# Patient Record
Sex: Female | Born: 1995 | Race: White | Hispanic: No | Marital: Single | Smoking: Never smoker
Health system: Southern US, Community
[De-identification: ages and names within clinical notes are randomized; demographics above are authoritative.]

## PROBLEM LIST (undated history)

## (undated) DIAGNOSIS — E079 Disorder of thyroid, unspecified: Secondary | ICD-10-CM

## (undated) DIAGNOSIS — J45909 Unspecified asthma, uncomplicated: Secondary | ICD-10-CM

## (undated) DIAGNOSIS — D497 Neoplasm of unspecified behavior of endocrine glands and other parts of nervous system: Secondary | ICD-10-CM

## (undated) HISTORY — PX: WISDOM TOOTH EXTRACTION: SHX21

---

## 2017-06-28 ENCOUNTER — Emergency Department: Payer: Medicaid - Out of State

## 2017-06-28 ENCOUNTER — Observation Stay
Admission: EM | Admit: 2017-06-28 | Discharge: 2017-06-29 | Disposition: A | Discharge status: Home / Self Care | Payer: Medicaid - Out of State | Attending: Internal Medicine | Admitting: Internal Medicine

## 2017-06-28 ENCOUNTER — Other Ambulatory Visit: Payer: Self-pay

## 2017-06-28 ENCOUNTER — Encounter: Payer: Self-pay | Admitting: Emergency Medicine

## 2017-06-28 DIAGNOSIS — J45909 Unspecified asthma, uncomplicated: Secondary | ICD-10-CM | POA: Diagnosis present

## 2017-06-28 DIAGNOSIS — E039 Hypothyroidism, unspecified: Secondary | ICD-10-CM | POA: Insufficient documentation

## 2017-06-28 DIAGNOSIS — F17220 Nicotine dependence, chewing tobacco, uncomplicated: Secondary | ICD-10-CM | POA: Diagnosis not present

## 2017-06-28 DIAGNOSIS — Z79899 Other long term (current) drug therapy: Secondary | ICD-10-CM | POA: Diagnosis not present

## 2017-06-28 DIAGNOSIS — R197 Diarrhea, unspecified: Secondary | ICD-10-CM | POA: Diagnosis present

## 2017-06-28 DIAGNOSIS — A084 Viral intestinal infection, unspecified: Secondary | ICD-10-CM | POA: Diagnosis present

## 2017-06-28 DIAGNOSIS — Z23 Encounter for immunization: Secondary | ICD-10-CM | POA: Insufficient documentation

## 2017-06-28 DIAGNOSIS — N179 Acute kidney failure, unspecified: Secondary | ICD-10-CM | POA: Diagnosis present

## 2017-06-28 HISTORY — DX: Unspecified asthma, uncomplicated: J45.909

## 2017-06-28 HISTORY — DX: Disorder of thyroid, unspecified: E07.9

## 2017-06-28 HISTORY — DX: Neoplasm of unspecified behavior of endocrine glands and other parts of nervous system: D49.7

## 2017-06-28 LAB — BASIC METABOLIC PANEL
ANION GAP: 8 (ref 5–15)
BUN: 20 mg/dL (ref 6–20)
CHLORIDE: 110 mmol/L (ref 101–111)
CO2: 20 mmol/L — AB (ref 22–32)
Calcium: 8.2 mg/dL — ABNORMAL LOW (ref 8.9–10.3)
Creatinine, Ser: 1.8 mg/dL — ABNORMAL HIGH (ref 0.44–1.00)
GFR calc non Af Amer: 40 mL/min — ABNORMAL LOW (ref >60)
GFR, EST AFRICAN AMERICAN: 46 mL/min — AB (ref >60)
GLUCOSE: 84 mg/dL (ref 65–99)
POTASSIUM: 4 mmol/L (ref 3.5–5.1)
Sodium: 138 mmol/L (ref 135–145)

## 2017-06-28 LAB — COMPREHENSIVE METABOLIC PANEL
ALT: 29 U/L (ref 14–54)
AST: 20 U/L (ref 15–41)
Albumin: 3.8 g/dL (ref 3.5–5.0)
Alkaline Phosphatase: 74 U/L (ref 38–126)
Anion gap: 9 (ref 5–15)
BILIRUBIN TOTAL: 0.4 mg/dL (ref 0.3–1.2)
BUN: 22 mg/dL — AB (ref 6–20)
CHLORIDE: 105 mmol/L (ref 101–111)
CO2: 22 mmol/L (ref 22–32)
CREATININE: 1.94 mg/dL — AB (ref 0.44–1.00)
Calcium: 9.2 mg/dL (ref 8.9–10.3)
GFR, EST AFRICAN AMERICAN: 42 mL/min — AB (ref >60)
GFR, EST NON AFRICAN AMERICAN: 36 mL/min — AB (ref >60)
Glucose, Bld: 90 mg/dL (ref 65–99)
POTASSIUM: 4.3 mmol/L (ref 3.5–5.1)
Sodium: 136 mmol/L (ref 135–145)
TOTAL PROTEIN: 7.5 g/dL (ref 6.5–8.1)

## 2017-06-28 LAB — URINALYSIS, COMPLETE (UACMP) WITH MICROSCOPIC
BILIRUBIN URINE: NEGATIVE
Bacteria, UA: NONE SEEN
GLUCOSE, UA: NEGATIVE mg/dL
Ketones, ur: NEGATIVE mg/dL
NITRITE: NEGATIVE
Protein, ur: NEGATIVE mg/dL
SPECIFIC GRAVITY, URINE: 1.006 (ref 1.005–1.030)
pH: 6 (ref 5.0–8.0)

## 2017-06-28 LAB — CBC
HEMATOCRIT: 35.9 % (ref 35.0–47.0)
Hemoglobin: 11.6 g/dL — ABNORMAL LOW (ref 12.0–16.0)
MCH: 25.6 pg — ABNORMAL LOW (ref 26.0–34.0)
MCHC: 32.3 g/dL (ref 32.0–36.0)
MCV: 79.4 fL — AB (ref 80.0–100.0)
PLATELETS: 295 10*3/uL (ref 150–440)
RBC: 4.52 MIL/uL (ref 3.80–5.20)
RDW: 16.3 % — AB (ref 11.5–14.5)
WBC: 11.1 10*3/uL — AB (ref 3.6–11.0)

## 2017-06-28 LAB — LIPASE, BLOOD: LIPASE: 28 U/L (ref 11–51)

## 2017-06-28 LAB — POCT PREGNANCY, URINE: Preg Test, Ur: NEGATIVE

## 2017-06-28 LAB — PREGNANCY, URINE: Preg Test, Ur: NEGATIVE

## 2017-06-28 MED ORDER — SODIUM CHLORIDE 0.9 % IV SOLN
INTRAVENOUS | Status: DC
  Administered 2017-06-29 (×2): via INTRAVENOUS

## 2017-06-28 MED ORDER — ACETAMINOPHEN 650 MG RE SUPP: 650.0000 mg | Freq: Four times a day (QID) | RECTAL | Status: DC | PRN

## 2017-06-28 MED ORDER — ONDANSETRON HCL 4 MG PO TABS: 4.0000 mg | ORAL_TABLET | Freq: Four times a day (QID) | ORAL | Status: DC | PRN

## 2017-06-28 MED ORDER — SODIUM CHLORIDE 0.9 % IV BOLUS (SEPSIS)
1000.0000 mL | Freq: Once | INTRAVENOUS | Status: AC
  Administered 2017-06-28: 1000 mL via INTRAVENOUS

## 2017-06-28 MED ORDER — ONDANSETRON HCL 4 MG/2ML IJ SOLN: 4.0000 mg | Freq: Four times a day (QID) | INTRAMUSCULAR | Status: DC | PRN

## 2017-06-28 MED ORDER — SODIUM CHLORIDE 0.9 % IV SOLN
Freq: Once | INTRAVENOUS | Status: AC
  Administered 2017-06-28: 22:00:00 via INTRAVENOUS

## 2017-06-28 MED ORDER — HEPARIN SODIUM (PORCINE) 5000 UNIT/ML IJ SOLN
5000.0000 [IU] | Freq: Three times a day (TID) | INTRAMUSCULAR | Status: DC
  Administered 2017-06-29: 5000 [IU] via SUBCUTANEOUS
  Filled 2017-06-28: qty 1

## 2017-06-28 MED ORDER — ALBUTEROL SULFATE (2.5 MG/3ML) 0.083% IN NEBU: 3.0000 mL | INHALATION_SOLUTION | Freq: Four times a day (QID) | RESPIRATORY_TRACT | Status: DC | PRN

## 2017-06-28 MED ORDER — ACETAMINOPHEN 500 MG PO TABS
1000.0000 mg | ORAL_TABLET | Freq: Once | ORAL | Status: AC
  Administered 2017-06-28: 1000 mg via ORAL
  Filled 2017-06-28 (×2): qty 2

## 2017-06-28 MED ORDER — ACETAMINOPHEN 325 MG PO TABS: 650.0000 mg | ORAL_TABLET | Freq: Four times a day (QID) | ORAL | Status: DC | PRN

## 2017-06-28 MED ORDER — ONDANSETRON HCL 4 MG/2ML IJ SOLN
4.0000 mg | Freq: Once | INTRAMUSCULAR | Status: AC
  Administered 2017-06-28: 4 mg via INTRAVENOUS
  Filled 2017-06-28: qty 2

## 2017-06-28 MED ORDER — SODIUM CHLORIDE 0.9 % IV BOLUS (SEPSIS)
1000.0000 mL | Freq: Once | INTRAVENOUS | Status: AC
Start: 2017-06-28 — End: 2017-06-28
  Administered 2017-06-28: 1000 mL via INTRAVENOUS

## 2017-06-28 NOTE — ED Triage Notes (Addendum)
Pt c/o subjective fever and chills and nausea since Monday.  Pt c/o intermittent nausea.  Pt reports multiple episodes of diarrhea also.    Pt c/o abdominal cramping that is constant but does get worse at times.

## 2017-06-28 NOTE — ED Notes (Signed)
Pt presents with lower abdominal pain x 2 days. States she has had nausea and loose stools as well. Pt reports 4-5 loose stool episodes today. Doesn't really know if she has increased frequency; denies pain/burning upon urination. Pt alert & oriented with NAD noted.

## 2017-06-28 NOTE — H&P (Signed)
[facility] [place] NAME: Courtney Webster    MR#:  [id number]  DATE OF BIRTH:  04/16/1996  DATE OF ADMISSION:  06/28/2017  PRIMARY CARE PHYSICIAN: Patient, No Pcp Per   REQUESTING/REFERRING PHYSICIAN: Alfred Levins, MD  CHIEF COMPLAINT:   Chief Complaint  Patient presents with  . Fever  . Nausea    HISTORY OF PRESENT ILLNESS:  Courtney Webster  is a 21 y.o. female who presents with several days of diarrhea along with some nausea and vomiting.  Patient states that her diarrhea got much worse today and so she came to the ED for evaluation.  Here she was found to have significant AKI.  Hospitalists were called for admission  PAST MEDICAL HISTORY:   Past Medical History:  Diagnosis Date  . Asthma   . Pituitary tumor   . Thyroid disease     PAST SURGICAL HISTORY:   Past Surgical History:  Procedure Laterality Date  . WISDOM TOOTH EXTRACTION      SOCIAL HISTORY:   Social History   Tobacco Use  . Smoking status: Never Smoker  . Smokeless tobacco: Current User    Types: Chew  Substance Use Topics  . Alcohol use: No    Frequency: Never    FAMILY HISTORY:   Family History  Problem Relation Age of Onset  . Diabetes Other     DRUG ALLERGIES:  No Known Allergies  MEDICATIONS AT HOME:   Prior to Admission medications   Not on File    REVIEW OF SYSTEMS:  Review of Systems  Constitutional: Negative for chills, fever, malaise/fatigue and weight loss.  HENT: Negative for ear pain, hearing loss and tinnitus.   Eyes: Negative for blurred vision, double vision, pain and redness.  Respiratory: Negative for cough, hemoptysis and shortness of breath.   Cardiovascular: Negative for chest pain, palpitations, orthopnea and leg swelling.  Gastrointestinal: Positive for diarrhea, nausea and vomiting. Negative for abdominal pain and constipation.  Genitourinary: Negative for dysuria, frequency and hematuria.  Musculoskeletal:  Negative for back pain, joint pain and neck pain.  Skin:       No acne, rash, or lesions  Neurological: Negative for dizziness, tremors, focal weakness and weakness.  Endo/Heme/Allergies: Negative for polydipsia. Does not bruise/bleed easily.  Psychiatric/Behavioral: Negative for depression. The patient is not nervous/anxious and does not have insomnia.      VITAL SIGNS:   Vitals:   06/28/17 1753 06/28/17 1759 06/28/17 2142  BP:  124/84 126/65  Pulse:  82   Resp:  18 16  Temp:  98.2 F (36.8 C) 98.5 F (36.9 C)  TempSrc:  Oral Oral  SpO2:  100% 100%  Weight: (!) 144.7 kg (319 lb)    Height: 5\' 11"  (1.803 m)     Wt Readings from Last 3 Encounters:  06/28/17 (!) 144.7 kg (319 lb)    PHYSICAL EXAMINATION:  Physical Exam  Vitals reviewed. Constitutional: She is oriented to person, place, and time. She appears well-developed and well-nourished. No distress.  HENT:  Head: Normocephalic and atraumatic.  Dry mucous membranes  Eyes: Conjunctivae and EOM are normal. Pupils are equal, round, and reactive to light. No scleral icterus.  Neck: Normal range of motion. Neck supple. No JVD present. No thyromegaly present.  Cardiovascular: Normal rate, regular rhythm and intact distal pulses. Exam reveals no gallop and no friction rub.  No murmur heard. Respiratory: Effort normal and breath sounds normal. No respiratory distress. She has no wheezes.  She has no rales.  GI: Soft. Bowel sounds are normal. She exhibits no distension. There is no tenderness.  Musculoskeletal: Normal range of motion. She exhibits no edema.  No arthritis, no gout  Lymphadenopathy:    She has no cervical adenopathy.  Neurological: She is alert and oriented to person, place, and time. No cranial nerve deficit.  No dysarthria, no aphasia  Skin: Skin is warm and dry. No rash noted. No erythema.  Psychiatric: She has a normal mood and affect. Her behavior is normal. Judgment and thought content normal.     LABORATORY PANEL:   CBC Recent Labs  Lab 06/28/17 1754  WBC 11.1*  HGB 11.6*  HCT 35.9  PLT 295   ------------------------------------------------------------------------------------------------------------------  Chemistries  Recent Labs  Lab 06/28/17 1754 06/28/17 2121  NA 136 138  K 4.3 4.0  CL 105 110  CO2 22 20*  GLUCOSE 90 84  BUN 22* 20  CREATININE 1.94* 1.80*  CALCIUM 9.2 8.2*  AST 20  --   ALT 29  --   ALKPHOS 74  --   BILITOT 0.4  --    ------------------------------------------------------------------------------------------------------------------  Cardiac Enzymes No results for input(s): TROPONINI in the last 168 hours. ------------------------------------------------------------------------------------------------------------------  RADIOLOGY:  Dg Abdomen Acute W/chest  Result Date: 06/28/2017 CLINICAL DATA:  Fever and chills with nausea. EXAM: DG ABDOMEN ACUTE W/ 1V CHEST COMPARISON:  None. FINDINGS: The lungs are clear without focal pneumonia, edema, pneumothorax or pleural effusion. The cardiopericardial silhouette is within normal limits for size. The visualized bony structures of the thorax are intact.Upright film shows no evidence for intraperitoneal free air. There is no evidence for gaseous bowel dilation to suggest obstruction. No unexpected abdominopelvic calcification. Thoracolumbar scoliosis evident. IMPRESSION: 1. No acute cardiopulmonary findings. 2. No intraperitoneal free air or evidence of bowel obstruction. Electronically Signed   By: Misty Stanley M.D.   On: 06/28/2017 20:22    EKG:  No orders found for this or any previous visit.  IMPRESSION AND PLAN:  Principal Problem:   AKI (acute kidney injury) ([place]) -likely due to profound dehydration from her diarrhea and vomiting.  Suspect this is all prerenal injury.  We will hydrate her significantly tonight and reevaluate her renal function in the morning.  Avoid nephrotoxins Active  Problems:   Diarrhea -likely viral, however we will check a GI panel she has any further diarrhea   Asthma -as needed inhaler  All the records are reviewed and case discussed with ED provider. Management plans discussed with the patient and/or family.  DVT PROPHYLAXIS: SubQ heparin  GI PROPHYLAXIS: None  ADMISSION STATUS: Observation  CODE STATUS: Full Code Status History    This patient does not have a recorded code status. Please follow your organizational policy for patients in this situation.      TOTAL TIME TAKING CARE OF THIS PATIENT: 40 minutes.   Caoimhe Damron FIELDING 06/28/2017, 10:36 PM  [facility]  Office  [phone]  CC: Primary care physician; Patient, No Pcp Per  Note:  This document was prepared using Dragon voice recognition software and may include unintentional dictation errors.

## 2017-06-28 NOTE — ED Provider Notes (Signed)
[facility] Emergency Department Provider Note  ____________________________________________  Time seen: Approximately 7:38 PM  I have reviewed the triage vital signs and the nursing notes.   HISTORY  Chief Complaint Fever and Nausea   HPI Courtney Webster is a 21 y.o. 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 Patient reports that her symptoms have been ongoing for 2 days. She is complaining of cramping diffuse abdominal pain, associated with nausea, 4-5 episodes of loose stool and 2 episodes of nonbloody nonbilious emesis. She has had chills but no fever. No flulike symptoms, no dysuria or hematuria, no chest pain or shortness of breath, no neck stiffness, no rash, no headache.  Past Medical History:  Diagnosis Date  . Asthma   . Pituitary tumor   . Thyroid disease     There are no active problems to display for this patient.   Past Surgical History:  Procedure Laterality Date  . WISDOM TOOTH EXTRACTION      Prior to Admission medications   Not on File    Allergies Patient has no known allergies.  No family history on file.  Social History Social History   Tobacco Use  . Smoking status: Never Smoker  . Smokeless tobacco: Current User    Types: Chew  Substance Use Topics  . Alcohol use: No    Frequency: Never  . Drug use: No    Review of Systems  Constitutional: Negative for fever. + chills Eyes: Negative for visual changes. ENT: Negative for sore throat. Neck: No neck pain  Cardiovascular: Negative for chest pain. Respiratory: Negative for shortness of breath. Gastrointestinal: + abdominal pain, vomiting and diarrhea. Genitourinary: Negative for dysuria. Musculoskeletal: Negative for back pain. Skin: Negative for rash. Neurological: Negative for headaches, weakness or numbness. Psych: No SI or  HI  ____________________________________________   PHYSICAL EXAM:  VITAL SIGNS: ED Triage Vitals  Enc Vitals Group     BP 06/28/17 1759 124/84     Pulse Rate 06/28/17 1759 82     Resp 06/28/17 1759 18     Temp 06/28/17 1759 98.2 F (36.8 C)     Temp Source 06/28/17 1759 Oral     SpO2 06/28/17 1759 100 %     Weight 06/28/17 1753 (!) 319 lb (144.7 kg)     Height 06/28/17 1753 5\' 11"  (1.803 m)     Head Circumference --      Peak Flow --      Pain Score 06/28/17 1753 7     Pain Loc --      Pain Edu? --      Excl. in [place]? --     Constitutional: Alert and oriented. Well appearing and in no apparent distress. HEENT:      Head: Normocephalic and atraumatic.         Eyes: Conjunctivae are normal. Sclera is non-icteric.       Mouth/Throat: Mucous membranes are moist.       Neck: Supple with no signs of meningismus. Cardiovascular: Regular rate and rhythm. No murmurs, gallops, or rubs. 2+ symmetrical distal pulses are present in all extremities. No JVD. Respiratory: Normal respiratory effort. Lungs are clear to auscultation bilaterally. No wheezes, crackles, or rhonchi.  Gastrointestinal: Soft, mild diffuse tenderness throughout, and non distended with positive bowel sounds. No rebound or guarding. Genitourinary: No CVA tenderness. Musculoskeletal: Nontender with normal range of motion in all extremities. No edema, cyanosis, or erythema of  extremities. Neurologic: Normal speech and language. Face is symmetric. Moving all extremities. No gross focal neurologic deficits are appreciated. Skin: Skin is warm, dry and intact. No rash noted. Psychiatric: Mood and affect are normal. Speech and behavior are normal.  ____________________________________________   LABS (all labs ordered are listed, but only abnormal results are displayed)  Labs Reviewed  COMPREHENSIVE METABOLIC PANEL - Abnormal; Notable for the following components:      Result Value   BUN 22 (*)    Creatinine, Ser 1.94  (*)    GFR calc non Af Amer 36 (*)    GFR calc Af Amer 42 (*)    All other components within normal limits  CBC - Abnormal; Notable for the following components:   WBC 11.1 (*)    Hemoglobin 11.6 (*)    MCV 79.4 (*)    MCH 25.6 (*)    RDW 16.3 (*)    All other components within normal limits  URINALYSIS, COMPLETE (UACMP) WITH MICROSCOPIC - Abnormal; Notable for the following components:   Color, Urine YELLOW (*)    APPearance HAZY (*)    Hgb urine dipstick SMALL (*)    Leukocytes, UA MODERATE (*)    Squamous Epithelial / LPF 0-5 (*)    All other components within normal limits  BASIC METABOLIC PANEL - Abnormal; Notable for the following components:   CO2 20 (*)    Creatinine, Ser 1.80 (*)    Calcium 8.2 (*)    GFR calc non Af Amer 40 (*)    GFR calc Af Amer 46 (*)    All other components within normal limits  C DIFFICILE QUICK SCREEN W PCR REFLEX  LIPASE, BLOOD  PREGNANCY, URINE  POC URINE PREG, ED  POCT PREGNANCY, URINE   ____________________________________________  EKG  none ____________________________________________  RADIOLOGY  KUB/CXR:  ____________________________________________   PROCEDURES  Procedure(s) performed: None Procedures Critical Care performed:  None ____________________________________________   INITIAL IMPRESSION / ASSESSMENT AND PLAN / ED COURSE   21 y.o. 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 Patient is well-appearing, in no distress, she has normal vital signs, her abdomen is obese, soft, diffusely tender throughout with no rebound or guarding. Her labs are concerning for acute kidney injury with a creatinine of 1.94, and mild leukocytosis with white count of 11.1. Lipase and LFTs are within normal limits. UA showing moderate leukocytes with some white blood cells but no bacteria. Urine culture has been  sent. presentation concerning for severe dehydration in the setting of a viral gastroenteritis. We'll give 2 L of IV fluid and Zofran. We'll send stool for C. difficile culture. We'll reassess after fluids.    _________________________ 10:25 PM on 06/28/2017 -----------------------------------------  Patient were no further episodes of vomiting or diarrhea in the emergency department however creatinine barely improved after 2 L of NS therefore will place patient on continuous IV hydration overnight and admit for observation and hydration.   As part of my medical decision making, I reviewed the following data within the [facility] notes reviewed and incorporated, Labs reviewed , Radiograph reviewed , Discussed with admitting physician , Notes from prior ED visits and [place] Controlled Substance Database    Pertinent labs & imaging results that were available during my care of the patient were reviewed by me and considered in my medical decision making (see chart for details).  ____________________________________________   FINAL CLINICAL IMPRESSION(S) / ED DIAGNOSES  Final diagnoses:  AKI (acute kidney injury) ([place])  Viral gastroenteritis      NEW MEDICATIONS STARTED DURING THIS VISIT:  ED Discharge Orders    None       Note:  This document was prepared using Dragon voice recognition software and may include unintentional dictation errors.    Rudene Re, MD 06/28/17 2226

## 2017-06-29 LAB — BASIC METABOLIC PANEL
Anion gap: 5 (ref 5–15)
BUN: 18 mg/dL (ref 6–20)
CALCIUM: 8.6 mg/dL — AB (ref 8.9–10.3)
CHLORIDE: 113 mmol/L — AB (ref 101–111)
CO2: 22 mmol/L (ref 22–32)
CREATININE: 1.73 mg/dL — AB (ref 0.44–1.00)
GFR calc non Af Amer: 42 mL/min — ABNORMAL LOW (ref >60)
GFR, EST AFRICAN AMERICAN: 48 mL/min — AB (ref >60)
GLUCOSE: 124 mg/dL — AB (ref 65–99)
Potassium: 3.7 mmol/L (ref 3.5–5.1)
Sodium: 140 mmol/L (ref 135–145)

## 2017-06-29 LAB — CBC
HCT: 32.4 % — ABNORMAL LOW (ref 35.0–47.0)
Hemoglobin: 10.7 g/dL — ABNORMAL LOW (ref 12.0–16.0)
MCH: 26.5 pg (ref 26.0–34.0)
MCHC: 33 g/dL (ref 32.0–36.0)
MCV: 80.3 fL (ref 80.0–100.0)
PLATELETS: 221 10*3/uL (ref 150–440)
RBC: 4.04 MIL/uL (ref 3.80–5.20)
RDW: 16.3 % — ABNORMAL HIGH (ref 11.5–14.5)
WBC: 8.8 10*3/uL (ref 3.6–11.0)

## 2017-06-29 MED ORDER — ONDANSETRON HCL 4 MG PO TABS: 4.0000 mg | ORAL_TABLET | Freq: Four times a day (QID) | ORAL | 0 refills | Status: AC | PRN

## 2017-06-29 MED ORDER — INFLUENZA VAC SPLIT HIGH-DOSE 0.5 ML IM SUSY: 0.5000 mL | PREFILLED_SYRINGE | INTRAMUSCULAR | Status: DC

## 2017-06-29 MED ORDER — INFLUENZA VAC SPLIT QUAD 0.5 ML IM SUSY
0.5000 mL | PREFILLED_SYRINGE | Freq: Once | INTRAMUSCULAR | Status: AC
  Administered 2017-06-29: 0.5 mL via INTRAMUSCULAR
  Filled 2017-06-29: qty 0.5

## 2017-06-29 NOTE — Discharge Instructions (Signed)
Acute Kidney Injury, Adult Acute kidney injury is a sudden worsening of kidney function. The kidneys are organs that have several jobs. They filter the blood to remove waste products and extra fluid. They also maintain a healthy balance of minerals and hormones in the body, which helps control blood pressure and keep bones strong. With this condition, your kidneys do not do their jobs as well as they should. This condition ranges from mild to severe. Over time it may develop into long-lasting (chronic) kidney disease. Early detection and treatment may prevent acute kidney injury from developing into a chronic condition. What are the causes? Common causes of this condition include:  A problem with blood flow to the kidneys. This may be caused by:  Low blood pressure (hypotension) or shock.  Blood loss.  Heart and blood vessel (cardiovascular) disease.  Severe burns.  Liver disease.  Direct damage to the kidneys. This may be caused by:  Certain medicines.  A kidney infection.  Poisoning.  Being around or in contact with toxic substances.  A surgical wound.  A hard, direct hit to the kidney area.  A sudden blockage of urine flow. This may be caused by:  Cancer.  Kidney stones.  An enlarged prostate in males. What are the signs or symptoms? Symptoms of this condition may not be obvious until the condition becomes severe. Symptoms of this condition can include:  Tiredness (lethargy), or difficulty staying awake.  Nausea or vomiting.  Swelling (edema) of the face, legs, ankles, or feet.  Problems with urination, such as:  Abdominal pain, or pain along the side of your stomach (flank).  Decreased urine production.  Decrease in the force of urine flow.  Muscle twitches and cramps, especially in the legs.  Confusion or trouble concentrating.  Loss of appetite.  Fever. How is this diagnosed? This condition may be diagnosed with tests, including:  Blood  tests.  Urine tests.  Imaging tests.  A test in which a sample of tissue is removed from the kidneys to be examined under a microscope (kidney biopsy). How is this treated? Treatment for this condition depends on the cause and how severe the condition is. In mild cases, treatment may not be needed. The kidneys may heal on their own. In more severe cases, treatment will involve:  Treating the cause of the kidney injury. This may involve changing any medicines you are taking or adjusting your dosage.  Fluids. You may need specialized IV fluids to balance your body's needs.  Having a catheter placed to drain urine and prevent blockages.  Preventing problems from occurring. This may mean avoiding certain medicines or procedures that can cause further injury to the kidneys. In some cases treatment may also require:  A procedure to remove toxic wastes from the body (dialysis or continuous renal replacement therapy - CRRT).  Surgery. This may be done to repair a torn kidney, or to remove the blockage from the urinary system. Follow these instructions at home: Medicines   Take over-the-counter and prescription medicines only as told by your health care provider.  Do not take any new medicines without your health care provider's approval. Many medicines can worsen your kidney damage.  Do not take any vitamin and mineral supplements without your health care provider's approval. Many nutritional supplements can worsen your kidney damage. Lifestyle   If your health care provider prescribed changes to your diet, follow them. You may need to decrease the amount of protein you eat.  Achieve and maintain a   healthy weight. If you need help with this, ask your health care provider.  Start or continue an exercise plan. Try to exercise at least 30 minutes a day, 5 days a week.  Do not use any tobacco products, such as cigarettes, chewing tobacco, and e-cigarettes. If you need help quitting, ask  your health care provider. General instructions   Keep track of your blood pressure. Report changes in your blood pressure as told by your health care provider.  Stay up to date with immunizations. Ask your health care provider which immunizations you need.  Keep all follow-up visits as told by your health care provider. This is important. Where to find more information:  American Association of Kidney Patients: www.aakp.org  National Kidney Foundation: www.kidney.org  American Kidney Fund: www.akfinc.org  Life Options Rehabilitation Program:  www.lifeoptions.org  www.kidneyschool.org Contact a health care provider if:  Your symptoms get worse.  You develop new symptoms. Get help right away if:  You develop symptoms of worsening kidney disease, which include:  Headaches.  Abnormally dark or light skin.  Easy bruising.  Frequent hiccups.  Chest pain.  Shortness of breath.  End of menstruation in women.  Seizures.  Confusion or altered mental status.  Abdominal or back pain.  Itchiness.  You have a fever.  Your body is producing less urine.  You have pain or bleeding when you urinate. Summary  Acute kidney injury is a sudden worsening of kidney function.  Acute kidney injury can be caused by problems with blood flow to the kidneys, direct damage to the kidneys, and sudden blockage of urine flow.  Symptoms of this condition may not be obvious until it becomes severe. Symptoms may include edema, lethargy, confusion, nausea or vomiting, and problems passing urine.  This condition can usually be diagnosed with blood tests, urine tests, and imaging tests. Sometimes a kidney biopsy is done to diagnose this condition.  Treatment for this condition often involves treating the underlying cause. It is treated with fluids, medicines, dialysis, diet changes, or surgery. This information is not intended to replace advice given to you by your health care provider.  Make sure you discuss any questions you have with your health care provider. Document Released: 01/30/2011 Document Revised: 07/07/2016 Document Reviewed: 07/07/2016 Elsevier Interactive Patient Education  2017 [employer] Inc.  

## 2017-06-29 NOTE — Progress Notes (Signed)
Pt ready for d/c home today per MD. Discharge instructions and prescription reviewed with pt, all questions answered. PIV removed. Belongings packed and will be sent with pt. Assisted to personal vehicle via volunteer.   [place], Jerry Caras

## 2017-06-30 LAB — HIV ANTIBODY (ROUTINE TESTING W REFLEX): HIV Screen 4th Generation wRfx: NONREACTIVE

## 2017-06-30 NOTE — Discharge Summary (Signed)
[facility] [place] NAME: Lynden Carrithers    MR#:  [id number]  DATE OF BIRTH:  01/21/1996  DATE OF ADMISSION:  06/28/2017   ADMITTING PHYSICIAN: Lance Coon, MD  DATE OF DISCHARGE: 06/29/2017 10:40 AM  PRIMARY CARE PHYSICIAN: Patient, No Pcp Per   ADMISSION DIAGNOSIS:  Viral gastroenteritis [A08.4] AKI (acute kidney injury) ([place]) [N17.9] DISCHARGE DIAGNOSIS:  Principal Problem:   AKI (acute kidney injury) ([place]) Active Problems:   Diarrhea   Asthma  SECONDARY DIAGNOSIS:   Past Medical History:  Diagnosis Date  . Asthma   . Pituitary tumor   . Thyroid disease    HOSPITAL COURSE:  21 y.o. female admitted for several days of diarrhea along with some nausea and vomiting.   * AKI (acute kidney injury) ([place]) -likely due to profound dehydration from her diarrhea and vomiting.  - Prerenal etio, resolved with IVFs  * Diarrhea -likely viral, no stool/diarrhea in the Hospital.  * Asthma -as needed inhaler DISCHARGE CONDITIONS:  stable CONSULTS OBTAINED:   DRUG ALLERGIES:  No Known Allergies DISCHARGE MEDICATIONS:   Allergies as of 06/29/2017   No Known Allergies     Medication List    TAKE these medications   ondansetron 4 MG tablet Commonly known as:  ZOFRAN Take 1 tablet (4 mg total) by mouth every 6 (six) hours as needed for nausea.        DISCHARGE INSTRUCTIONS:   DIET:  Regular diet DISCHARGE CONDITION:  Good ACTIVITY:  Activity as tolerated OXYGEN:  Home Oxygen: No.  Oxygen Delivery: room air DISCHARGE LOCATION:  home   If you experience worsening of your admission symptoms, develop shortness of breath, life threatening emergency, suicidal or homicidal thoughts you must seek medical attention immediately by calling 911 or calling your MD immediately  if symptoms less severe.  You Must read complete instructions/literature along with all the possible adverse reactions/side effects for all the  Medicines you take and that have been prescribed to you. Take any new Medicines after you have completely understood and accpet all the possible adverse reactions/side effects.   Please note  You were cared for by a hospitalist during your hospital stay. If you have any questions about your discharge medications or the care you received while you were in the hospital after you are discharged, you can call the unit and asked to speak with the hospitalist on call if the hospitalist that took care of you is not available. Once you are discharged, your primary care physician will handle any further medical issues. Please note that NO REFILLS for any discharge medications will be authorized once you are discharged, as it is imperative that you return to your primary care physician (or establish a relationship with a primary care physician if you do not have one) for your aftercare needs so that they can reassess your need for medications and monitor your lab values.    On the day of Discharge:  VITAL SIGNS:  Blood pressure 106/69, pulse 83, temperature 98.3 F (36.8 C), temperature source Oral, resp. rate 18, height 5\' 11"  (1.803 m), weight (!) 138.4 kg (305 lb 1.6 oz), last menstrual period 05/27/2017, SpO2 100 %. PHYSICAL EXAMINATION:  GENERAL:  21 y.o.-year-old patient lying in the bed with no acute distress.  EYES: Pupils equal, round, reactive to light and accommodation. No scleral icterus. Extraocular muscles intact.  HEENT: Head atraumatic, normocephalic. Oropharynx and nasopharynx clear.  NECK:  Supple, no jugular venous  distention. No thyroid enlargement, no tenderness.  LUNGS: Normal breath sounds bilaterally, no wheezing, rales,rhonchi or crepitation. No use of accessory muscles of respiration.  CARDIOVASCULAR: S1, S2 normal. No murmurs, rubs, or gallops.  ABDOMEN: Soft, non-tender, non-distended. Bowel sounds present. No organomegaly or mass.  EXTREMITIES: No pedal edema, cyanosis, or  clubbing.  NEUROLOGIC: Cranial nerves II through XII are intact. Muscle strength 5/5 in all extremities. Sensation intact. Gait not checked.  PSYCHIATRIC: The patient is alert and oriented x 3.  SKIN: No obvious rash, lesion, or ulcer.  DATA REVIEW:   CBC Recent Labs  Lab 06/29/17 0257  WBC 8.8  HGB 10.7*  HCT 32.4*  PLT 221    Chemistries  Recent Labs  Lab 06/28/17 1754  06/29/17 0257  NA 136   < > 140  K 4.3   < > 3.7  CL 105   < > 113*  CO2 22   < > 22  GLUCOSE 90   < > 124*  BUN 22*   < > 18  CREATININE 1.94*   < > 1.73*  CALCIUM 9.2   < > 8.6*  AST 20  --   --   ALT 29  --   --   ALKPHOS 74  --   --   BILITOT 0.4  --   --    < > = values in this interval not displayed.     Follow-up Information    Baxter Hire, MD. Schedule an appointment as soon as possible for a visit in 1 week.   Specialty:  Internal Medicine Why:  [email or web address] office will call Patient w/ Appt Contact information: [place] [phone]           Management plans discussed with the patient, family and they are in agreement.  CODE STATUS: Prior   TOTAL TIME TAKING CARE OF THIS PATIENT: 45 minutes.    Max Sane M.D on 06/30/2017 at 12:16 PM  Between 7am to 6pm - Pager - [phone]  After 6pm go to [email or web address] - Proofreader  [facility]  Office  [phone]  CC: Primary care physician; Patient, No Pcp Per   Note: This dictation was prepared with Dragon dictation along with smaller phrase technology. Any transcriptional errors that result from this process are unintentional.

## 2019-05-21 IMAGING — CR DG ABDOMEN ACUTE W/ 1V CHEST
1 series · 4 of 4 positions shown · non-contrast
Comparison: None.

CLINICAL DATA: Fever and chills with nausea.

EXAM:
DG ABDOMEN ACUTE W/ 1V CHEST

[Series 1: dg abd acute w/chest · 0.14mm/px · 4 of 4 slices shown]
[im 1/4]
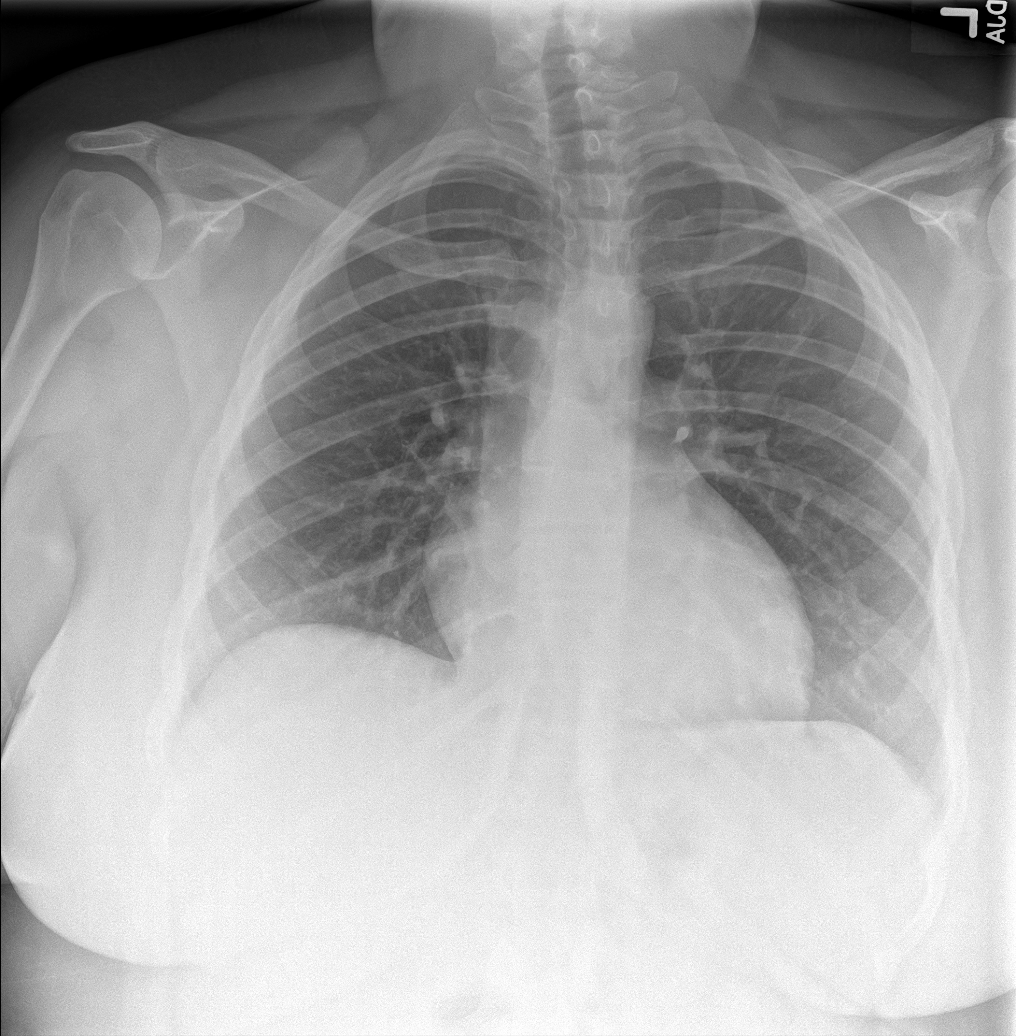
[im 2/4]
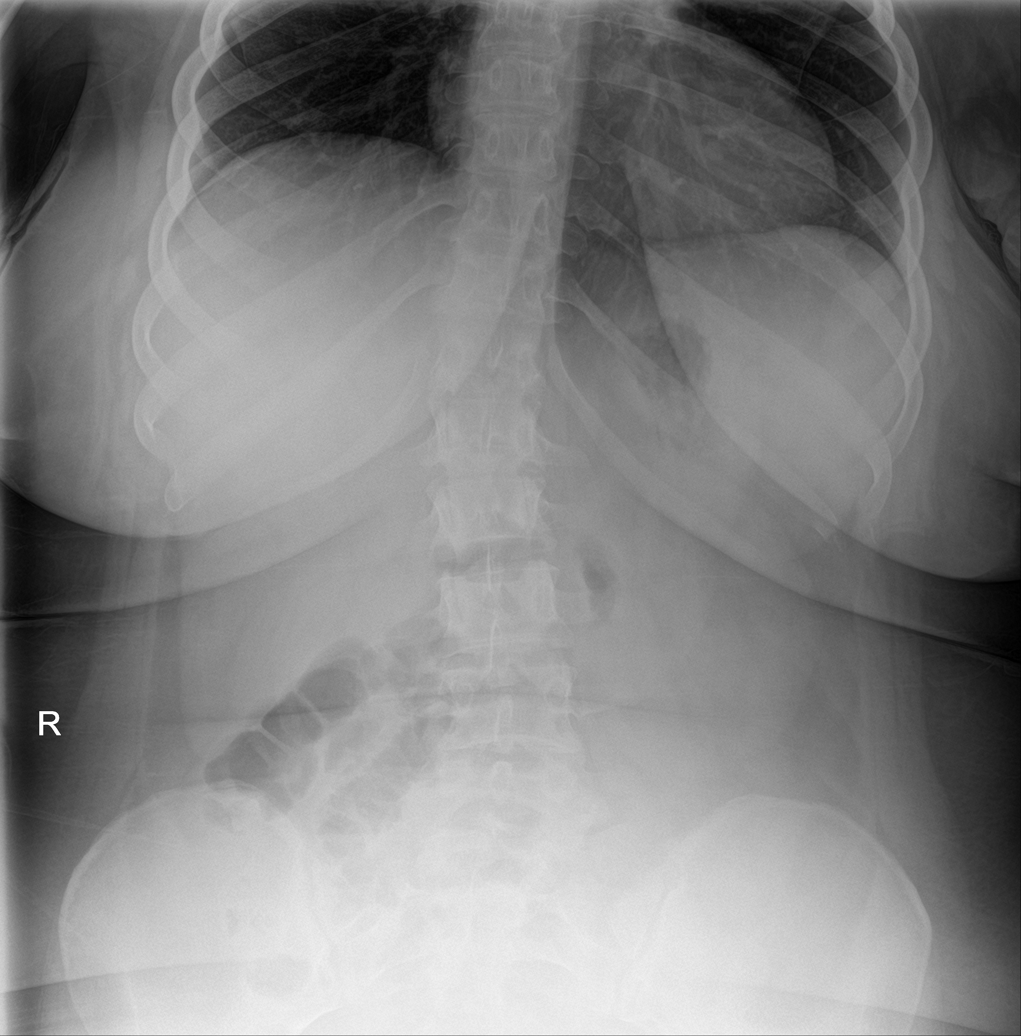
[im 3/4]
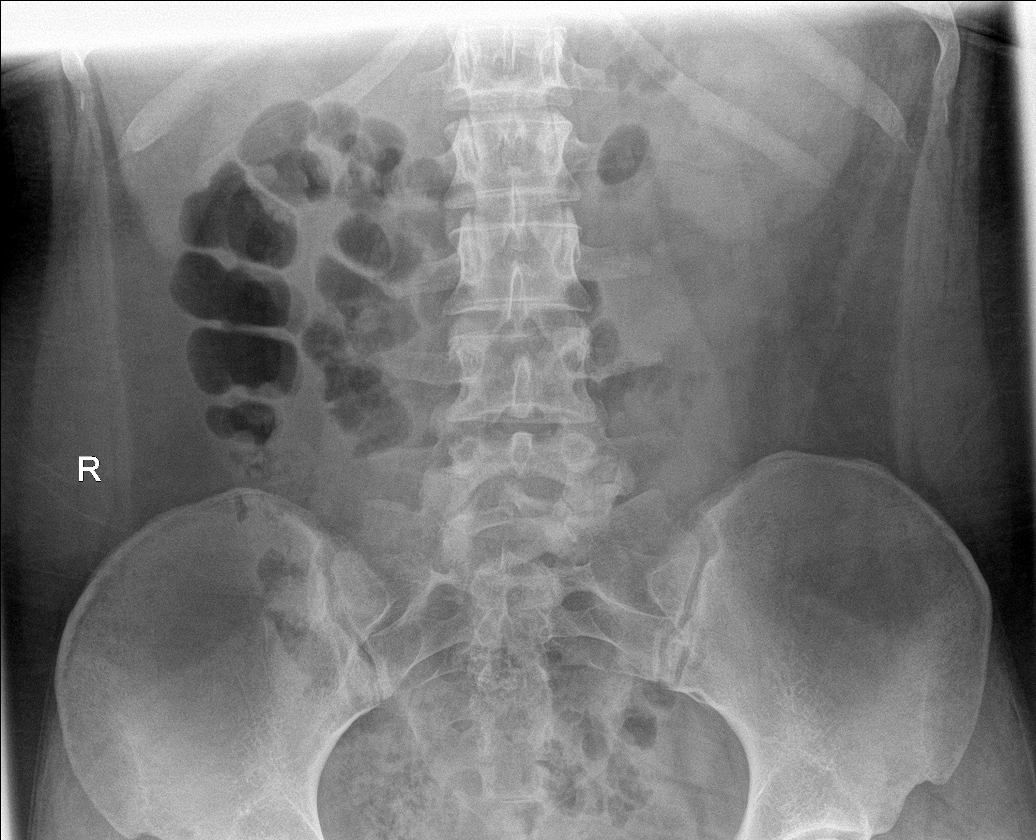
[im 4/4]
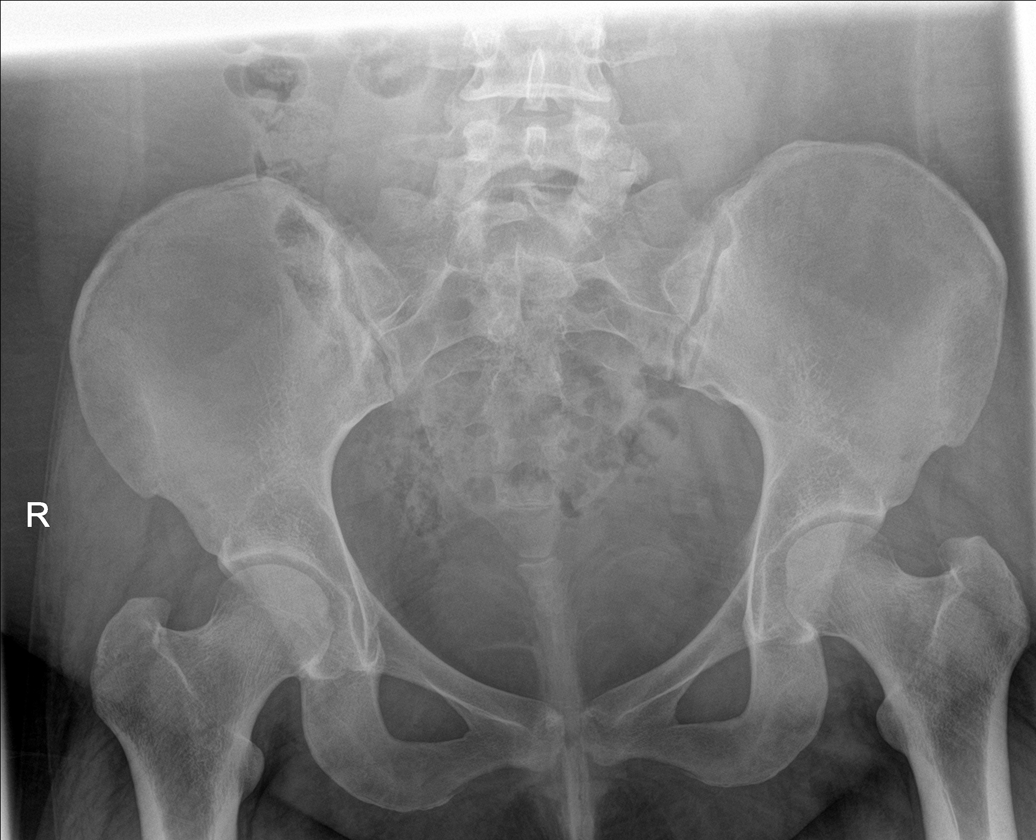

[4 of 4 positions shown; findings below may reference images not displayed]

FINDINGS: The lungs are clear without focal pneumonia, edema, pneumothorax or
pleural effusion. The cardiopericardial silhouette is within normal
limits for size. The visualized bony structures of the thorax are
intact.Upright film shows no evidence for intraperitoneal free air.
There is no evidence for gaseous bowel dilation to suggest
obstruction. No unexpected abdominopelvic calcification.
Thoracolumbar scoliosis evident.
IMPRESSION: 1. No acute cardiopulmonary findings.
2. No intraperitoneal free air or evidence of bowel obstruction.
